# Patient Record
Sex: Female | Born: 2005 | Hispanic: Yes | Marital: Single | Smoking: Never smoker
Health system: Southern US, Community
[De-identification: ages and names within clinical notes are randomized; demographics above are authoritative.]

## PROBLEM LIST (undated history)

## (undated) DIAGNOSIS — Z789 Other specified health status: Secondary | ICD-10-CM

## (undated) HISTORY — DX: Other specified health status: Z78.9

---
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
## 2024-03-01 ENCOUNTER — Ambulatory Visit: Payer: Self-pay

## 2024-03-01 VITALS — BP 116/60 | Ht 61.0 in | Wt 153.5 lb

## 2024-03-01 DIAGNOSIS — Z3201 Encounter for pregnancy test, result positive: Secondary | ICD-10-CM

## 2024-03-01 DIAGNOSIS — Z3009 Encounter for other general counseling and advice on contraception: Secondary | ICD-10-CM

## 2024-03-01 LAB — PREGNANCY, URINE: Preg Test, Ur: POSITIVE — AB

## 2024-03-01 MED ORDER — PRENATAL 27-0.8 MG PO TABS: 1.0000 | ORAL_TABLET | Freq: Every day | ORAL | Status: AC

## 2024-03-01 NOTE — Progress Notes (Signed)
 UPT positive. Plans prenatal care at [facility]. Positive preg packet given and reviewed.   The patient was dispensed prenatal vitamins #100 today per SO Dr JAYSON Helling. I provided counseling today regarding the medication. We discussed the medication, the side effects and when to call clinic. Patient given the opportunity to ask questions. Questions answered.    Sent to clerk for presumptive eligibility/medicaid/preg women and new OB appt. Crystal Casso, RN

## 2024-03-11 ENCOUNTER — Telehealth: Payer: Self-pay

## 2024-03-11 ENCOUNTER — Telehealth: Payer: Self-pay | Admitting: Family Medicine

## 2024-03-11 NOTE — Telephone Encounter (Signed)
 Returned patient phone call. Patient states she had spotting the other day. An U/S was performed and she was told to follow up with her provider. Patient states spotting now light pink. Recommended patient keep her scheduled appointment with [facility] maternity and if vaginal spotting/bleeding increases to return to the ER. Patient verbalized understanding. BTHIELE RN
